# Patient Record
Sex: Female | Born: 2000 | Race: Black or African American | Hispanic: No | Marital: Single | Smoking: Never smoker
Health system: Southern US, Community
[De-identification: ages and names within clinical notes are randomized; demographics above are authoritative.]

## PROBLEM LIST (undated history)

## (undated) DIAGNOSIS — I10 Essential (primary) hypertension: Secondary | ICD-10-CM

---

## 2020-12-29 ENCOUNTER — Other Ambulatory Visit: Payer: Self-pay

## 2020-12-29 ENCOUNTER — Encounter (HOSPITAL_COMMUNITY): Payer: Self-pay | Admitting: Emergency Medicine

## 2020-12-29 ENCOUNTER — Emergency Department (HOSPITAL_COMMUNITY): Payer: 59

## 2020-12-29 ENCOUNTER — Emergency Department (HOSPITAL_COMMUNITY)
Admission: EM | Admit: 2020-12-29 | Discharge: 2020-12-29 | Disposition: A | Discharge status: Home / Self Care | Payer: 59 | Attending: Emergency Medicine | Admitting: Emergency Medicine

## 2020-12-29 DIAGNOSIS — H02846 Edema of left eye, unspecified eyelid: Secondary | ICD-10-CM | POA: Diagnosis not present

## 2020-12-29 DIAGNOSIS — S0990XA Unspecified injury of head, initial encounter: Secondary | ICD-10-CM | POA: Diagnosis present

## 2020-12-29 DIAGNOSIS — Y92410 Unspecified street and highway as the place of occurrence of the external cause: Secondary | ICD-10-CM | POA: Diagnosis not present

## 2020-12-29 DIAGNOSIS — H5789 Other specified disorders of eye and adnexa: Secondary | ICD-10-CM

## 2020-12-29 DIAGNOSIS — S060X0A Concussion without loss of consciousness, initial encounter: Secondary | ICD-10-CM | POA: Diagnosis not present

## 2020-12-29 LAB — POC URINE PREG, ED: Preg Test, Ur: NEGATIVE

## 2020-12-29 MED ORDER — NAPROXEN 500 MG PO TABS
500.0000 mg | ORAL_TABLET | Freq: Once | ORAL | Status: AC
  Administered 2020-12-29: 500 mg via ORAL
  Filled 2020-12-29: qty 1

## 2020-12-29 MED ORDER — TETRACAINE HCL 0.5 % OP SOLN
1.0000 [drp] | Freq: Once | OPHTHALMIC | Status: AC
  Administered 2020-12-29: 1 [drp] via OPHTHALMIC
  Filled 2020-12-29: qty 4

## 2020-12-29 MED ORDER — ACETAMINOPHEN 325 MG PO TABS
650.0000 mg | ORAL_TABLET | Freq: Once | ORAL | Status: AC
  Administered 2020-12-29: 650 mg via ORAL
  Filled 2020-12-29: qty 2

## 2020-12-29 MED ORDER — FLUORESCEIN SODIUM 1 MG OP STRP
1.0000 | ORAL_STRIP | Freq: Once | OPHTHALMIC | Status: AC
  Administered 2020-12-29: 1 via OPHTHALMIC
  Filled 2020-12-29: qty 1
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
## 2020-12-29 NOTE — Discharge Instructions (Addendum)
You have been seen and discharged from the emergency department.  You have sustained a concussion and left eye irritation.  CT imaging was negative.  Take Tylenol/ibuprofen for pain control.  Stay well-hydrated.  Follow-up with your primary provider for reevaluation and further care. Take home medications as prescribed. If you have any worsening symptoms or further concerns for your health please return to an emergency department for further evaluation.
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
## 2022-04-05 DIAGNOSIS — R69 Illness, unspecified: Secondary | ICD-10-CM | POA: Diagnosis not present

## 2022-04-10 DIAGNOSIS — R69 Illness, unspecified: Secondary | ICD-10-CM | POA: Diagnosis not present

## 2022-04-12 DIAGNOSIS — L501 Idiopathic urticaria: Secondary | ICD-10-CM | POA: Diagnosis not present

## 2022-04-26 DIAGNOSIS — R69 Illness, unspecified: Secondary | ICD-10-CM | POA: Diagnosis not present

## 2022-05-08 DIAGNOSIS — R69 Illness, unspecified: Secondary | ICD-10-CM | POA: Diagnosis not present

## 2022-06-12 DIAGNOSIS — R69 Illness, unspecified: Secondary | ICD-10-CM | POA: Diagnosis not present
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
## 2022-07-19 DIAGNOSIS — R69 Illness, unspecified: Secondary | ICD-10-CM | POA: Diagnosis not present

## 2022-08-10 DIAGNOSIS — R69 Illness, unspecified: Secondary | ICD-10-CM | POA: Diagnosis not present

## 2022-09-14 ENCOUNTER — Emergency Department (HOSPITAL_COMMUNITY)
Admission: EM | Admit: 2022-09-14 | Discharge: 2022-09-15 | Disposition: A | Discharge status: Home / Self Care | Payer: PRIVATE HEALTH INSURANCE | Attending: Emergency Medicine | Admitting: Emergency Medicine

## 2022-09-14 ENCOUNTER — Encounter (HOSPITAL_COMMUNITY): Payer: Self-pay | Admitting: Emergency Medicine

## 2022-09-14 DIAGNOSIS — Z7721 Contact with and (suspected) exposure to potentially hazardous body fluids: Secondary | ICD-10-CM | POA: Diagnosis not present

## 2022-09-14 LAB — COMPREHENSIVE METABOLIC PANEL
ALT: 13 U/L (ref 0–44)
AST: 19 U/L (ref 15–41)
Albumin: 4.3 g/dL (ref 3.5–5.0)
Alkaline Phosphatase: 45 U/L (ref 38–126)
Anion gap: 10 (ref 5–15)
BUN: 11 mg/dL (ref 6–20)
CO2: 24 mmol/L (ref 22–32)
Calcium: 9.3 mg/dL (ref 8.9–10.3)
Chloride: 104 mmol/L (ref 98–111)
Creatinine, Ser: 1.01 mg/dL — ABNORMAL HIGH (ref 0.44–1.00)
GFR, Estimated: >60 mL/min (ref >60)
Glucose, Bld: 92 mg/dL (ref 70–99)
Potassium: 3.5 mmol/L (ref 3.5–5.1)
Sodium: 138 mmol/L (ref 135–145)
Total Bilirubin: 0.5 mg/dL (ref 0.3–1.2)
Total Protein: 7.7 g/dL (ref 6.5–8.1)

## 2022-09-14 LAB — RAPID HIV SCREEN (HIV 1/2 AB+AG)
HIV 1/2 Antibodies: NONREACTIVE
HIV-1 P24 Antigen - HIV24: NONREACTIVE

## 2022-09-14 MED ORDER — ELVITEG-COBIC-EMTRICIT-TENOFAF 150-150-200-10 MG PO TABS: 1.0000 | ORAL_TABLET | Freq: Every day | ORAL | 0 refills | Status: DC

## 2022-09-14 MED ORDER — ELVITEG-COBIC-EMTRICIT-TENOFAF 150-150-200-10 MG PREPACK
1.0000 | ORAL_TABLET | Freq: Once | ORAL | Status: AC
  Administered 2022-09-14: 1 via ORAL
  Filled 2022-09-14: qty 1

## 2022-09-14 NOTE — ED Triage Notes (Addendum)
Pt presents for possible body fluid exposure.  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  She states that she has no obvious open wounds on her hands and used hand sanitizer after the exposure. There was no needle stick to this patient. 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   Pt denies PMH or daily meds that cause her to be immunocompromised.
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
## 2022-09-14 NOTE — Discharge Instructions (Addendum)
Start the prescription. Follow-up with employee health for additional testing. Your supervisor should have that information to employee health.  Follow-up Recommendations: - Check HIV Ab at 6 weeks, and a 4th generation Ag/Ab test or HIV RNA test at 3-4 months.

## 2022-09-15 LAB — HCG, QUANTITATIVE, PREGNANCY: hCG, Beta Chain, Quant, S: <1 m[IU]/mL (ref <5)

## 2022-09-15 LAB — HEPATITIS C ANTIBODY: HCV Ab: NONREACTIVE

## 2022-09-15 LAB — RPR: RPR Ser Ql: NONREACTIVE

## 2022-09-15 LAB — HEPATITIS B SURFACE ANTIGEN: Hepatitis B Surface Ag: NONREACTIVE

## 2022-10-01 ENCOUNTER — Ambulatory Visit: Payer: No Typology Code available for payment source | Admitting: Student

## 2023-07-31 ENCOUNTER — Other Ambulatory Visit: Payer: Self-pay

## 2023-07-31 ENCOUNTER — Encounter (HOSPITAL_COMMUNITY): Payer: Self-pay

## 2023-07-31 ENCOUNTER — Emergency Department (HOSPITAL_COMMUNITY)
Admission: EM | Admit: 2023-07-31 | Discharge: 2023-07-31 | Disposition: A | Discharge status: Home / Self Care | Payer: No Typology Code available for payment source

## 2023-07-31 ENCOUNTER — Emergency Department (HOSPITAL_COMMUNITY): Payer: No Typology Code available for payment source

## 2023-07-31 DIAGNOSIS — Z20822 Contact with and (suspected) exposure to covid-19: Secondary | ICD-10-CM | POA: Insufficient documentation

## 2023-07-31 DIAGNOSIS — J101 Influenza due to other identified influenza virus with other respiratory manifestations: Secondary | ICD-10-CM | POA: Insufficient documentation

## 2023-07-31 DIAGNOSIS — E876 Hypokalemia: Secondary | ICD-10-CM | POA: Diagnosis not present

## 2023-07-31 DIAGNOSIS — R059 Cough, unspecified: Secondary | ICD-10-CM | POA: Diagnosis present

## 2023-07-31 HISTORY — DX: Essential (primary) hypertension: I10

## 2023-07-31 LAB — CBC WITH DIFFERENTIAL/PLATELET
Abs Immature Granulocytes: 0.02 10*3/uL (ref 0.00–0.07)
Basophils Absolute: 0 10*3/uL (ref 0.0–0.1)
Basophils Relative: 0 %
Eosinophils Absolute: 0 10*3/uL (ref 0.0–0.5)
Eosinophils Relative: 1 %
HCT: 39.7 % (ref 36.0–46.0)
Hemoglobin: 12.9 g/dL (ref 12.0–15.0)
Immature Granulocytes: 0 %
Lymphocytes Relative: 5 %
Lymphs Abs: 0.3 10*3/uL — ABNORMAL LOW (ref 0.7–4.0)
MCH: 25.5 pg — ABNORMAL LOW (ref 26.0–34.0)
MCHC: 32.5 g/dL (ref 30.0–36.0)
MCV: 78.5 fL — ABNORMAL LOW (ref 80.0–100.0)
Monocytes Absolute: 0.4 10*3/uL (ref 0.1–1.0)
Monocytes Relative: 6 %
Neutro Abs: 5.8 10*3/uL (ref 1.7–7.7)
Neutrophils Relative %: 88 %
Platelets: 221 10*3/uL (ref 150–400)
RBC: 5.06 MIL/uL (ref 3.87–5.11)
RDW: 14.5 % (ref 11.5–15.5)
WBC: 6.6 10*3/uL (ref 4.0–10.5)
nRBC: 0 % (ref 0.0–0.2)

## 2023-07-31 LAB — COMPREHENSIVE METABOLIC PANEL
ALT: 14 U/L (ref 0–44)
AST: 23 U/L (ref 15–41)
Albumin: 4.6 g/dL (ref 3.5–5.0)
Alkaline Phosphatase: 51 U/L (ref 38–126)
Anion gap: 10 (ref 5–15)
BUN: 9 mg/dL (ref 6–20)
CO2: 24 mmol/L (ref 22–32)
Calcium: 9.3 mg/dL (ref 8.9–10.3)
Chloride: 99 mmol/L (ref 98–111)
Creatinine, Ser: 0.91 mg/dL (ref 0.44–1.00)
GFR, Estimated: >60 mL/min (ref >60)
Glucose, Bld: 135 mg/dL — ABNORMAL HIGH (ref 70–99)
Potassium: 3.2 mmol/L — ABNORMAL LOW (ref 3.5–5.1)
Sodium: 133 mmol/L — ABNORMAL LOW (ref 135–145)
Total Bilirubin: 0.4 mg/dL (ref <1.2)
Total Protein: 8.8 g/dL — ABNORMAL HIGH (ref 6.5–8.1)

## 2023-07-31 LAB — RESP PANEL BY RT-PCR (RSV, FLU A&B, COVID)  RVPGX2
Influenza A by PCR: POSITIVE — AB
Influenza B by PCR: NEGATIVE
Resp Syncytial Virus by PCR: NEGATIVE
SARS Coronavirus 2 by RT PCR: NEGATIVE

## 2023-07-31 LAB — I-STAT CG4 LACTIC ACID, ED: Lactic Acid, Venous: 0.8 mmol/L (ref 0.5–1.9)

## 2023-07-31 LAB — HCG, SERUM, QUALITATIVE: Preg, Serum: NEGATIVE

## 2023-07-31 MED ORDER — ONDANSETRON 4 MG PO TBDP: 4.0000 mg | ORAL_TABLET | Freq: Three times a day (TID) | ORAL | 0 refills | Status: DC | PRN

## 2023-07-31 MED ORDER — ACETAMINOPHEN 500 MG PO TABS
1000.0000 mg | ORAL_TABLET | Freq: Once | ORAL | Status: AC
  Administered 2023-07-31: 1000 mg via ORAL
  Filled 2023-07-31: qty 2

## 2023-07-31 MED ORDER — POTASSIUM CHLORIDE CRYS ER 20 MEQ PO TBCR: 20.0000 meq | EXTENDED_RELEASE_TABLET | Freq: Two times a day (BID) | ORAL | 0 refills | Status: DC

## 2023-07-31 NOTE — Discharge Instructions (Signed)
Please alternate Tylenol and ibuprofen.  You can take 400 mg of ibuprofen every 6 hours, and 1 g of Tylenol every 6 hours.  Do not take more than 4 g of Tylenol in 1 day.  Do this for fever control, and bodyaches.  I have also prescribed you some potassium, please have this rechecked in about a week, to make sure your labs are within normal limits.  Return to the ER if you have severe shortness of breath, or chest pain

## 2023-07-31 NOTE — ED Triage Notes (Signed)
Pt states that she had a cold 2 days that has worsened today. Pt reports taking thera flu, tylenol pm, and nyquil. Pt has had a runny nose, headache, and body aches.

## 2023-07-31 NOTE — ED Provider Notes (Signed)
[place] EMERGENCY DEPARTMENT AT [facility] Provider Note   CSN: [id number] Arrival date & time: 07/31/23  3875     History  Chief Complaint  Patient presents with   Generalized Body Aches    Joyce Bond is a 22 y.o. female, no pertinent past medical history, who presents to the ED secondary to runny nose, cough, sore throat, headache, and bodyaches, this been going on for the last 2 to 3 days.  She states that she just feels very unwell, and she is having chills.  Father at bedside, states she has had increased fatigue.  She denies any shortness of breath or chest pain however.  Home Medications Prior to Admission medications   Medication Sig Start Date End Date Taking? Authorizing Provider  ondansetron (ZOFRAN-ODT) 4 MG disintegrating tablet Take 1 tablet (4 mg total) by mouth every 8 (eight) hours as needed. 07/31/23  Yes Cia Garretson L, PA  potassium chloride SA (KLOR-CON M) 20 MEQ tablet Take 1 tablet (20 mEq total) by mouth 2 (two) times daily. 07/31/23  Yes Anibal Quinby L, PA  elvitegravir-cobicistat-emtricitabine-tenofovir (GENVOYA) 150-150-200-10 MG TABS tablet Take 1 tablet by mouth daily with breakfast. 09/14/22   Derwood Kaplan, MD      Allergies    Patient has no known allergies.    Review of Systems   Review of Systems  Physical Exam Updated Vital Signs BP 121/84 (BP Location: Right Arm)   Pulse (!) 110   Temp 100.3 F (37.9 C) (Oral)   Resp 20   Ht 5\' 9"  (1.753 m)   Wt 83.9 kg   LMP 07/17/2023   SpO2 99%   BMI 27.32 kg/m  Physical Exam Vitals and nursing note reviewed.  Constitutional:      General: She is not in acute distress.    Appearance: She is well-developed.  HENT:     Head: Normocephalic and atraumatic.     Right Ear: Tympanic membrane normal.     Left Ear: Tympanic membrane normal.     Nose: Congestion present.     Mouth/Throat:     Pharynx: Posterior oropharyngeal erythema present. No oropharyngeal exudate.  Eyes:      Conjunctiva/sclera: Conjunctivae normal.  Cardiovascular:     Rate and Rhythm: Normal rate and regular rhythm.     Heart sounds: No murmur heard. Pulmonary:     Effort: Pulmonary effort is normal. No respiratory distress.     Breath sounds: Normal breath sounds.  Abdominal:     Palpations: Abdomen is soft.     Tenderness: There is no abdominal tenderness.  Musculoskeletal:        General: No swelling.     Cervical back: Neck supple.  Skin:    General: Skin is warm and dry.     Capillary Refill: Capillary refill takes less than 2 seconds.  Neurological:     Mental Status: She is alert.  Psychiatric:        Mood and Affect: Mood normal.     ED Results / Procedures / Treatments   Labs (all labs ordered are listed, but only abnormal results are displayed) Labs Reviewed  RESP PANEL BY RT-PCR (RSV, FLU A&B, COVID)  RVPGX2 - Abnormal; Notable for the following components:      Result Value   Influenza A by PCR POSITIVE (*)    All other components within normal limits  COMPREHENSIVE METABOLIC PANEL - Abnormal; Notable for the following components:   Sodium 133 (*)  Potassium 3.2 (*)    Glucose, Bld 135 (*)    Total Protein 8.8 (*)    All other components within normal limits  CBC WITH DIFFERENTIAL/PLATELET - Abnormal; Notable for the following components:   MCV 78.5 (*)    MCH 25.5 (*)    Lymphs Abs 0.3 (*)    All other components within normal limits  CULTURE, BLOOD (ROUTINE X 2)  CULTURE, BLOOD (ROUTINE X 2)  HCG, SERUM, QUALITATIVE  URINALYSIS, W/ REFLEX TO CULTURE (INFECTION SUSPECTED)  PROTIME-INR  I-STAT CG4 LACTIC ACID, ED  I-STAT CG4 LACTIC ACID, ED    EKG None  Radiology DG Chest 2 View Result Date: 07/31/2023 CLINICAL DATA:  Chest congestion EXAM: CHEST - 2 VIEW COMPARISON:  None Available. FINDINGS: The heart size and mediastinal contours are within normal limits. Both lungs are clear. The visualized skeletal structures are unremarkable. IMPRESSION:  No active cardiopulmonary disease. Electronically Signed   By: Minerva Fester M.D.   On: 07/31/2023 21:41    Procedures Procedures    Medications Ordered in ED Medications  acetaminophen (TYLENOL) tablet 1,000 mg (1,000 mg Oral Given 07/31/23 1943)    ED Course/ Medical Decision Making/ A&P                                 Medical Decision Making Patient is a 22 year old female, overall well-appearing, just having nasal congestion, sore throat, headache, and body chills.  She is tachycardic and febrile.  Sepsis workup was initiated by triage.  She is well-appearing overall on my exam.  She is found to be influenza A positive.  She is out of the timeframe, for Tamiflu, thus that was not offered.  We discussed conservative management, and temperature control.  Strict return precautions tachycardia likely thought to be secondary to febrile status  Amount and/or Complexity of Data Reviewed Labs: ordered. Radiology: ordered.  Risk OTC drugs. Prescription drug management.    Final Clinical Impression(s) / ED Diagnoses Final diagnoses:  Influenza A  Hypokalemia    Rx / DC Orders ED Discharge Orders          Ordered    potassium chloride SA (KLOR-CON M) 20 MEQ tablet  2 times daily        07/31/23 2310    ondansetron (ZOFRAN-ODT) 4 MG disintegrating tablet  Every 8 hours PRN        07/31/23 2310              Arnesha Schiraldi L, PA 07/31/23 2310    Coral Spikes, DO 07/31/23 2341

## 2023-08-05 LAB — CULTURE, BLOOD (ROUTINE X 2)
Culture: NO GROWTH
Special Requests: ADEQUATE

## 2024-03-10 ENCOUNTER — Other Ambulatory Visit: Payer: Self-pay

## 2024-03-10 ENCOUNTER — Emergency Department (HOSPITAL_BASED_OUTPATIENT_CLINIC_OR_DEPARTMENT_OTHER)
Admission: EM | Admit: 2024-03-10 | Discharge: 2024-03-10 | Disposition: A | Discharge status: Home / Self Care | Attending: Emergency Medicine | Admitting: Emergency Medicine

## 2024-03-10 ENCOUNTER — Encounter (HOSPITAL_BASED_OUTPATIENT_CLINIC_OR_DEPARTMENT_OTHER): Payer: Self-pay | Admitting: Emergency Medicine

## 2024-03-10 DIAGNOSIS — Y92410 Unspecified street and highway as the place of occurrence of the external cause: Secondary | ICD-10-CM | POA: Insufficient documentation

## 2024-03-10 DIAGNOSIS — R519 Headache, unspecified: Secondary | ICD-10-CM | POA: Diagnosis not present

## 2024-03-10 DIAGNOSIS — M542 Cervicalgia: Secondary | ICD-10-CM | POA: Insufficient documentation

## 2024-03-10 MED ORDER — METHOCARBAMOL 500 MG PO TABS
500.0000 mg | ORAL_TABLET | Freq: Two times a day (BID) | ORAL | 0 refills | Status: DC
Start: 2024-03-10 — End: 2024-04-02

## 2024-03-10 MED ORDER — METHOCARBAMOL 500 MG PO TABS
500.0000 mg | ORAL_TABLET | Freq: Once | ORAL | Status: AC
  Administered 2024-03-10: 500 mg via ORAL
  Filled 2024-03-10: qty 1
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
## 2024-03-19 ENCOUNTER — Ambulatory Visit (INDEPENDENT_AMBULATORY_CARE_PROVIDER_SITE_OTHER): Admitting: Obstetrics and Gynecology

## 2024-03-19 ENCOUNTER — Encounter: Payer: Self-pay | Admitting: Obstetrics and Gynecology

## 2024-03-19 ENCOUNTER — Other Ambulatory Visit (HOSPITAL_COMMUNITY)
Admission: RE | Admit: 2024-03-19 | Discharge: 2024-03-19 | Disposition: A | Discharge status: Home / Self Care | Source: Ambulatory Visit | Attending: Obstetrics and Gynecology | Admitting: Obstetrics and Gynecology

## 2024-03-19 VITALS — BP 115/74 | HR 56 | Ht 68.0 in | Wt 187.0 lb

## 2024-03-19 DIAGNOSIS — F39 Unspecified mood [affective] disorder: Secondary | ICD-10-CM

## 2024-03-19 DIAGNOSIS — Z01419 Encounter for gynecological examination (general) (routine) without abnormal findings: Secondary | ICD-10-CM

## 2024-03-19 DIAGNOSIS — Z113 Encounter for screening for infections with a predominantly sexual mode of transmission: Secondary | ICD-10-CM | POA: Diagnosis present

## 2024-03-19 DIAGNOSIS — Z124 Encounter for screening for malignant neoplasm of cervix: Secondary | ICD-10-CM | POA: Insufficient documentation

## 2024-03-19 DIAGNOSIS — Z1331 Encounter for screening for depression: Secondary | ICD-10-CM

## 2024-03-19 NOTE — Progress Notes (Signed)
 ANNUAL GYNECOLOGY VISIT Chief Complaint  Patient presents with   new patinet annual    Would like STD testing- No Symptoms      Subjective:  Joyce Bond is a 23 y.o. G0P0000 who presents for annual exam.  Doing well, no concerns. +PHQ and GAD. Patient moving at end of month to [place]  for [employer]. Finished med school requirements and studying for the [employer]. Has been a stressful few months.    Gyn History: Patient's last menstrual period was 03/01/2024. Sexually active: yes/no: Yes but not currently Contraception: declines History of STIs: No Last pap: No results found for: DIAGPAP, HPV, HPVHIGH, reports last pap 2024, none available for my review today History of abnormal pap: No Periods: regular   The pregnancy intention screening data noted above was reviewed. Potential methods of contraception were discussed. The patient elected to proceed with No data recorded.       03/19/2024    3:59 PM  Depression screen PHQ 2/9  Decreased Interest 1  Down, Depressed, Hopeless 2  PHQ - 2 Score 3  Altered sleeping 3  Tired, decreased energy 3  Change in appetite 3  Feeling bad or failure about yourself  2  Trouble concentrating 1  Moving slowly or fidgety/restless 0  Suicidal thoughts 0  PHQ-9 Score 15        03/19/2024    3:59 PM  GAD 7 : Generalized Anxiety Score  Nervous, Anxious, on Edge 2  Control/stop worrying 1  Worry too much - different things 2  Trouble relaxing 1  Restless 0  Easily annoyed or irritable 3  Afraid - awful might happen 0  Total GAD 7 Score 9      OB History     Gravida  0   Para  0   Term  0   Preterm  0   AB  0   Living  0      SAB  0   IAB  0   Ectopic  0   Multiple  0   Live Births  0           Past Medical History:  Diagnosis Date   HTN (hypertension)     History reviewed. No pertinent surgical history.  Social History   Socioeconomic History   Marital status: Single    Spouse name: Not  on file   Number of children: Not on file   Years of education: Not on file   Highest education level: Not on file  Occupational History   Not on file  Tobacco Use   Smoking status: Never   Smokeless tobacco: Never  Vaping Use   Vaping status: Never Used  Substance and Sexual Activity   Alcohol use: Never    Comment: Socially   Drug use: Never   Sexual activity: Not Currently    Birth control/protection: None  Other Topics Concern   Not on file  Social History Narrative   Not on file   Social Drivers of Health   Financial Resource Strain: Not on file  Food Insecurity: Not on file  Transportation Needs: Not on file  Physical Activity: Not on file  Stress: Not on file  Social Connections: Not on file    Family History  Problem Relation Age of Onset   Diabetes Father    Hypertension Mother     Current Outpatient Medications on File Prior to Visit  Medication Sig Dispense Refill   hydrochlorothiazide (MICROZIDE) 12.5 MG capsule Take  12.5 mg by mouth daily.     hydrOXYzine (ATARAX) 25 MG tablet Take 12.5 mg by mouth at bedtime as needed.     elvitegravir-cobicistat-emtricitabine-tenofovir (GENVOYA ) 150-150-200-10 MG TABS tablet Take 1 tablet by mouth daily with breakfast. (Patient not taking: Reported on 03/19/2024) 30 tablet 0   methocarbamol  (ROBAXIN ) 500 MG tablet Take 1 tablet (500 mg total) by mouth 2 (two) times daily. (Patient not taking: Reported on 03/19/2024) 20 tablet 0   ondansetron  (ZOFRAN -ODT) 4 MG disintegrating tablet Take 1 tablet (4 mg total) by mouth every 8 (eight) hours as needed. (Patient not taking: Reported on 03/19/2024) 20 tablet 0   potassium chloride  SA (KLOR-CON  M) 20 MEQ tablet Take 1 tablet (20 mEq total) by mouth 2 (two) times daily. (Patient not taking: Reported on 03/19/2024) 6 tablet 0   No current facility-administered medications on file prior to visit.    No Known Allergies   Objective:   Vitals:   03/19/24 1606  BP: 115/74  Pulse:  (!) 56  Weight: 187 lb 0.6 oz (84.8 kg)  Height: 5' 8 (1.727 m)   Physical Examination:   General appearance - well appearing, and in no distress  Mental status - alert, oriented to person, place, and time  Psych:  normal mood and affect  Skin - warm and dry, normal color, no suspicious lesions noted  Chest - effort normal   Heart - normal rate    Neck:  midline trachea, no thyromegaly or nodules  Breasts - breasts appear normal, no suspicious masses, no skin or nipple changes or  axillary nodes  Abdomen - soft, nontender, nondistended, no masses or organomegaly  Pelvic -  VULVA: normal appearing vulva with no masses, tenderness or lesions   VAGINA: normal appearing vagina with normal color and discharge, no lesions  CERVIX: normal appearing cervix without discharge or lesions, no CMT  Thin prep pap is done with reflex HR HPV cotesting  UTERUS: uterus is felt to be normal size, shape, consistency and nontender   ADNEXA: No adnexal masses or tenderness noted.  Extremities:  No swelling or varicosities noted  Chaperone present for exam  Assessment and Plan:  1. Well woman exam with routine gynecological exam (Primary) Pap Normal clinical breast exam, reviewed self breast exams, mammograms to start at age 17 STI testing Declines contraception  2. Cervical cancer screening - Cytology - PAP( [place])  3. Routine screening for STI (sexually transmitted infection) - HIV Antibody (routine testing w rflx); Future - RPR; Future - Hepatitis B surface antigen; Future - Hepatitis C antibody; Future - Cervicovaginal ancillary only  4. Mood disorder (HCC) Support given, offered medications or BH referral, declines    No follow-ups on file.  Future Appointments  Date Time Provider Department Center  03/23/2024 11:00 AM [facility]  04/02/2024  1:20 PM Jerrell Cleatus Ned, MD [facility]    Rollo ONEIDA Bring, MD, FACOG Obstetrician & Gynecologist,  [facility] for [facility], [facility]

## 2024-03-23 ENCOUNTER — Other Ambulatory Visit

## 2024-03-23 LAB — CERVICOVAGINAL ANCILLARY ONLY
Chlamydia: NEGATIVE
Comment: NEGATIVE
Comment: NEGATIVE
Comment: NORMAL
Neisseria Gonorrhea: NEGATIVE
Trichomonas: NEGATIVE

## 2024-03-24 ENCOUNTER — Ambulatory Visit: Payer: Self-pay | Admitting: Obstetrics and Gynecology

## 2024-03-24 LAB — CYTOLOGY - PAP: Diagnosis: NEGATIVE

## 2024-03-28 ENCOUNTER — Other Ambulatory Visit: Payer: Self-pay

## 2024-03-28 ENCOUNTER — Emergency Department (HOSPITAL_BASED_OUTPATIENT_CLINIC_OR_DEPARTMENT_OTHER)
Admission: EM | Admit: 2024-03-28 | Discharge: 2024-03-28 | Disposition: A | Discharge status: Home / Self Care | Attending: Emergency Medicine | Admitting: Emergency Medicine

## 2024-03-28 ENCOUNTER — Encounter (HOSPITAL_BASED_OUTPATIENT_CLINIC_OR_DEPARTMENT_OTHER): Payer: Self-pay

## 2024-03-28 ENCOUNTER — Emergency Department (HOSPITAL_BASED_OUTPATIENT_CLINIC_OR_DEPARTMENT_OTHER)

## 2024-03-28 DIAGNOSIS — R638 Other symptoms and signs concerning food and fluid intake: Secondary | ICD-10-CM

## 2024-03-28 DIAGNOSIS — R079 Chest pain, unspecified: Secondary | ICD-10-CM | POA: Diagnosis not present

## 2024-03-28 DIAGNOSIS — R42 Dizziness and giddiness: Secondary | ICD-10-CM

## 2024-03-28 DIAGNOSIS — R110 Nausea: Secondary | ICD-10-CM | POA: Diagnosis not present

## 2024-03-28 DIAGNOSIS — R002 Palpitations: Secondary | ICD-10-CM | POA: Insufficient documentation

## 2024-03-28 DIAGNOSIS — R519 Headache, unspecified: Secondary | ICD-10-CM | POA: Diagnosis not present

## 2024-03-28 DIAGNOSIS — E860 Dehydration: Secondary | ICD-10-CM | POA: Diagnosis not present

## 2024-03-28 LAB — COMPREHENSIVE METABOLIC PANEL WITH GFR
ALT: 9 U/L (ref 0–44)
AST: 17 U/L (ref 15–41)
Albumin: 4.2 g/dL (ref 3.5–5.0)
Alkaline Phosphatase: 47 U/L (ref 38–126)
Anion gap: 9 (ref 5–15)
BUN: 12 mg/dL (ref 6–20)
CO2: 25 mmol/L (ref 22–32)
Calcium: 9.7 mg/dL (ref 8.9–10.3)
Chloride: 104 mmol/L (ref 98–111)
Creatinine, Ser: 0.85 mg/dL (ref 0.44–1.00)
GFR, Estimated: >60 mL/min (ref >60)
Glucose, Bld: 102 mg/dL — ABNORMAL HIGH (ref 70–99)
Potassium: 3.7 mmol/L (ref 3.5–5.1)
Sodium: 138 mmol/L (ref 135–145)
Total Bilirubin: 0.3 mg/dL (ref 0.0–1.2)
Total Protein: 7.1 g/dL (ref 6.5–8.1)

## 2024-03-28 LAB — URINALYSIS, ROUTINE W REFLEX MICROSCOPIC
Bilirubin Urine: NEGATIVE
Glucose, UA: NEGATIVE mg/dL
Hgb urine dipstick: NEGATIVE
Ketones, ur: NEGATIVE mg/dL
Nitrite: NEGATIVE
Protein, ur: NEGATIVE mg/dL
Specific Gravity, Urine: 1.016 (ref 1.005–1.030)
pH: 6 (ref 5.0–8.0)

## 2024-03-28 LAB — CBC
HCT: 35.1 % — ABNORMAL LOW (ref 36.0–46.0)
Hemoglobin: 11.3 g/dL — ABNORMAL LOW (ref 12.0–15.0)
MCH: 25.2 pg — ABNORMAL LOW (ref 26.0–34.0)
MCHC: 32.2 g/dL (ref 30.0–36.0)
MCV: 78.3 fL — ABNORMAL LOW (ref 80.0–100.0)
Platelets: 216 K/uL (ref 150–400)
RBC: 4.48 MIL/uL (ref 3.87–5.11)
RDW: 14 % (ref 11.5–15.5)
WBC: 4.4 K/uL (ref 4.0–10.5)
nRBC: 0 % (ref 0.0–0.2)

## 2024-03-28 LAB — PREGNANCY, URINE: Preg Test, Ur: NEGATIVE

## 2024-03-28 LAB — TROPONIN T, HIGH SENSITIVITY: Troponin T High Sensitivity: <15 ng/L (ref 0–19)

## 2024-03-28 LAB — LIPASE, BLOOD: Lipase: 45 U/L (ref 11–51)

## 2024-03-28 MED ORDER — DIPHENHYDRAMINE HCL 25 MG PO CAPS
25.0000 mg | ORAL_CAPSULE | Freq: Once | ORAL | Status: AC
  Administered 2024-03-28: 25 mg via ORAL
  Filled 2024-03-28: qty 1

## 2024-03-28 MED ORDER — SODIUM CHLORIDE 0.9 % IV BOLUS
1000.0000 mL | Freq: Once | INTRAVENOUS | Status: AC
  Administered 2024-03-28: 1000 mL via INTRAVENOUS

## 2024-03-28 MED ORDER — METOCLOPRAMIDE HCL 5 MG/ML IJ SOLN
10.0000 mg | Freq: Once | INTRAMUSCULAR | Status: AC
  Administered 2024-03-28: 10 mg via INTRAVENOUS
  Filled 2024-03-28: qty 2

## 2024-03-28 MED ORDER — METOCLOPRAMIDE HCL 10 MG PO TABS: 10.0000 mg | ORAL_TABLET | Freq: Four times a day (QID) | ORAL | 0 refills | Status: DC

## 2024-03-28 NOTE — Discharge Instructions (Addendum)
 Try to drink 5-8 glasses of water each day Tylenol  1000mg  every 6 hours as needed for headache

## 2024-03-28 NOTE — ED Provider Notes (Signed)
 [place] EMERGENCY DEPARTMENT AT [facility] Provider Note   CSN: [id number] Arrival date & time: 03/28/24  9088     Patient presents with: Dizziness and Chest Pain   Joyce Bond is a 23 y.o. female.    Dizziness Associated symptoms: chest pain   Chest Pain Associated symptoms: dizziness    Patient is a 23 year old female in the emergency department with complaints of 2+ weeks of some heightened awareness of her chest, maybe palpitations, sometimes lightheadedness.  She denies chest pain but states that she feels more aware of this area.  She does not specifically feel that her heart is beating irregularly but is very aware of her heartbeat.  She states that the sensation is not exertional she has not passed out.  She denies any hemoptysis cough or shortness of breath.  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  No recent surgeries, hospitalization, long travel, hemoptysis, estrogen containing OCP, cancer history.  No unilateral leg swelling.  No history of PE or VTE.      Prior to Admission medications   Medication Sig Start Date End Date Taking? Authorizing Provider  metoCLOPramide  (REGLAN ) 10 MG tablet Take 1 tablet (10 mg total) by mouth every 6 (six) hours. 03/28/24  Yes Neldon Inoue S, PA  elvitegravir-cobicistat-emtricitabine-tenofovir (GENVOYA ) 150-150-200-10 MG TABS tablet Take 1 tablet by mouth daily with breakfast. Patient not taking: Reported on 03/19/2024 09/14/22   Nanavati, Ankit, MD  hydrochlorothiazide  (MICROZIDE ) 12.5 MG capsule Take 12.5 mg by mouth daily. 12/28/23   [provider]  hydrOXYzine (ATARAX) 25 MG tablet Take 12.5 mg by mouth at bedtime as needed.    [provider]  methocarbamol  (ROBAXIN ) 500 MG tablet Take 1 tablet (500 mg total) by mouth 2 (two) times daily. Patient not taking: Reported on 03/19/2024  03/10/24   Smoot, Lauraine LABOR, PA-C  ondansetron  (ZOFRAN -ODT) 4 MG disintegrating tablet Take 1 tablet (4 mg total) by mouth every 8 (eight) hours as needed. Patient not taking: Reported on 03/19/2024 07/31/23   Small, Brooke L, PA  potassium chloride  SA (KLOR-CON  M) 20 MEQ tablet Take 1 tablet (20 mEq total) by mouth 2 (two) times daily. Patient not taking: Reported on 03/19/2024 07/31/23   Small, Brooke L, PA    Allergies: Patient has no known allergies.    Review of Systems  Cardiovascular:  Positive for chest pain.  Neurological:  Positive for dizziness.    Updated Vital Signs BP (!) 139/101   Pulse 75   Temp 98 F (36.7 C) (Oral)   Resp 15   Ht 5' 8 (1.727 m)   Wt 81.2 kg   LMP 03/01/2024 (Exact Date)   SpO2 100%   BMI 27.22 kg/m   Physical Exam Vitals and nursing note reviewed.  Constitutional:      General: She is not in acute distress. HENT:     Head: Normocephalic and atraumatic.     Nose: Nose normal.     Mouth/Throat:     Mouth: Mucous membranes are moist.  Eyes:     General: No scleral icterus. Cardiovascular:     Rate and Rhythm: Normal rate and regular rhythm.     Pulses: Normal pulses.     Heart sounds: Normal heart sounds.  Pulmonary:     Effort: Pulmonary effort is normal. No respiratory distress.     Breath sounds: No  wheezing.  Abdominal:     Palpations: Abdomen is soft.     Tenderness: There is no abdominal tenderness. There is no guarding or rebound.  Musculoskeletal:     Cervical back: Normal range of motion.     Right lower leg: No edema.     Left lower leg: No edema.  Skin:    General: Skin is warm and dry.     Capillary Refill: Capillary refill takes less than 2 seconds.  Neurological:     Mental Status: She is alert. Mental status is at baseline.  Psychiatric:        Mood and Affect: Mood normal.        Behavior: Behavior normal.     (all labs ordered are listed, but only abnormal results are displayed) Labs Reviewed  COMPREHENSIVE  METABOLIC PANEL WITH GFR - Abnormal; Notable for the following components:      Result Value   Glucose, Bld 102 (*)    All other components within normal limits  CBC - Abnormal; Notable for the following components:   Hemoglobin 11.3 (*)    HCT 35.1 (*)    MCV 78.3 (*)    MCH 25.2 (*)    All other components within normal limits  URINALYSIS, ROUTINE W REFLEX MICROSCOPIC - Abnormal; Notable for the following components:   Leukocytes,Ua MODERATE (*)    Bacteria, UA RARE (*)    All other components within normal limits  LIPASE, BLOOD  PREGNANCY, URINE  TROPONIN T, HIGH SENSITIVITY    EKG: None  Radiology: DG Chest Portable 1 View Result Date: 03/28/2024 CLINICAL DATA:  Chest pain EXAM: PORTABLE CHEST 1 VIEW COMPARISON:  None Available. FINDINGS: Normal mediastinum and cardiac silhouette. Normal pulmonary vasculature. No evidence of effusion, infiltrate, or pneumothorax. No acute bony abnormality. IMPRESSION: No acute cardiopulmonary process. Electronically Signed   By: Jackquline Boxer M.D.   On: 03/28/2024 10:14     Procedures   Medications Ordered in the ED  metoCLOPramide  (REGLAN ) injection 10 mg (10 mg Intravenous Given 03/28/24 1007)  diphenhydrAMINE  (BENADRYL ) capsule 25 mg (25 mg Oral Given 03/28/24 1007)  sodium chloride  0.9 % bolus 1,000 mL (0 mLs Intravenous Stopped 03/28/24 1150)    Clinical Course as of 03/28/24 1605  Sat Mar 28, 2024  0945 For 2+ weeks palpitations, sometimes LH No syncope.  Not exertional.  [WF]    Clinical Course User Index [WF] Neldon Hamp RAMAN, [place]                                 Medical Decision Making Amount and/or Complexity of Data Reviewed Labs: ordered. Radiology: ordered.  Risk Prescription drug management.   This patient presents to the ED for concern of CP, this involves a number of treatment options, and is a complaint that carries with it a moderate to high risk of complications and morbidity. A differential diagnosis was  considered for the patient's symptoms which is discussed below:   The emergent causes of chest pain include: Acute coronary syndrome, tamponade, pericarditis/myocarditis, aortic dissection, pulmonary embolism, tension pneumothorax, pneumonia, and esophageal rupture.  I do not believe the patient has an emergent cause of chest pain, other urgent/non-acute considerations include, but are not limited to: chronic angina, aortic stenosis, cardiomyopathy, mitral valve prolapse, pulmonary hypertension, aortic insufficiency, right ventricular hypertrophy, pleuritis, bronchitis, pneumothorax, tumor, gastroesophageal reflux disease (GERD), esophageal spasm, Mallory-Weiss syndrome, peptic ulcer disease, pancreatitis, functional gastrointestinal pain,  cervical or thoracic disk disease or arthritis, shoulder arthritis, costochondritis, subacromial bursitis, anxiety or panic attack, herpes zoster, breast disorders, chest wall tumors, thoracic outlet syndrome, mediastinitis.    Co morbidities: Discussed in HPI   Brief History:  Patient is a 23 year old female in the emergency department with complaints of 2+ weeks of some heightened awareness of her chest, maybe palpitations, sometimes lightheadedness.  She denies chest pain but states that she feels more aware of this area.  She does not specifically feel that her heart is beating irregularly but is very aware of her heartbeat.  She states that the sensation is not exertional she has not passed out.  She denies any hemoptysis cough or shortness of breath.  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  No recent surgeries, hospitalization, long travel, hemoptysis, estrogen containing OCP, cancer history.  No unilateral leg swelling.  No history of PE or VTE.     EMR reviewed including pt PMHx, past surgical history and past visits to ER.   See HPI  for more details   Lab Tests:  I personally reviewed all laboratory work and imaging. Metabolic panel without any acute abnormality specifically kidney function within normal limits and no significant electrolyte abnormalities. CBC without leukocytosis or significant anemia.   Imaging Studies:  NAD. I personally reviewed all imaging studies and no acute abnormality found. I agree with radiology interpretation. Chest x-ray unremarkable   Cardiac Monitoring:  The patient was maintained on a cardiac monitor.  I personally viewed and interpreted the cardiac monitored which showed an underlying rhythm of: NSR EKG non-ischemic   Medicines ordered:  I ordered medication including 1 L of normal saline, Benadryl , Reglan  for headache, nausea, hydration Reevaluation of the patient after these medicines showed that the patient improved I have reviewed the patients home medicines and have made adjustments as needed   Critical Interventions:     Consults/Attending Physician      Reevaluation:  After the interventions noted above I re-evaluated patient and found that they have :improved   Social Determinants of Health:      Problem List / ED Course:  Considered list of concerning chest pain differentials patient's description of chest pain is actually more consistent with palpitations.  Considered thyroid testing however she is not tachycardic and does not appear to be in thyroid storm clinically.  Not anemic blood sugar is normal electrolytes normal recommend outpatient follow-up.  Certainly also considered anxiety as a cause of her symptoms given that she was post take the MCAT this morning and the symptoms precluded her from doing so.  Also considered PE although I think very unlikely in this particular patient as she is PERC negative and well-appearing and feels improved after some fluids and Benadryl  and Reglan .  Will discharge home return precautions to emergency room were  provided.   Dispostion:  After consideration of the diagnostic results and the patients response to treatment, I feel that the patent would benefit from discharge home with close follow-up with primary care.   Final diagnoses:  Acute nonintractable headache, unspecified headache type  Light headed  Dehydration symptoms    ED Discharge Orders          Ordered    metoCLOPramide  (REGLAN ) 10 MG tablet  Every 6 hours        03/28/24 1134  Neldon Inoue [place]ass[place], [place] 03/28/24 1611    Levander Houston, MD 04/02/24 [phone]
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
## 2024-03-31 DIAGNOSIS — J301 Allergic rhinitis due to pollen: Secondary | ICD-10-CM | POA: Insufficient documentation

## 2024-03-31 DIAGNOSIS — L501 Idiopathic urticaria: Secondary | ICD-10-CM | POA: Insufficient documentation

## 2024-03-31 DIAGNOSIS — J3081 Allergic rhinitis due to animal (cat) (dog) hair and dander: Secondary | ICD-10-CM | POA: Insufficient documentation

## 2024-03-31 DIAGNOSIS — J309 Allergic rhinitis, unspecified: Secondary | ICD-10-CM | POA: Insufficient documentation

## 2024-04-02 ENCOUNTER — Encounter: Payer: Self-pay | Admitting: Student in an Organized Health Care Education/Training Program

## 2024-04-02 ENCOUNTER — Ambulatory Visit (INDEPENDENT_AMBULATORY_CARE_PROVIDER_SITE_OTHER): Admitting: Student in an Organized Health Care Education/Training Program

## 2024-04-02 VITALS — BP 156/119 | HR 71 | Ht 68.0 in | Wt 189.8 lb

## 2024-04-02 DIAGNOSIS — Z309 Encounter for contraceptive management, unspecified: Secondary | ICD-10-CM | POA: Diagnosis not present

## 2024-04-02 DIAGNOSIS — G43C0 Periodic headache syndromes in child or adult, not intractable: Secondary | ICD-10-CM

## 2024-04-02 DIAGNOSIS — E611 Iron deficiency: Secondary | ICD-10-CM | POA: Diagnosis not present

## 2024-04-02 DIAGNOSIS — I10 Essential (primary) hypertension: Secondary | ICD-10-CM | POA: Diagnosis not present

## 2024-04-02 DIAGNOSIS — G43909 Migraine, unspecified, not intractable, without status migrainosus: Secondary | ICD-10-CM | POA: Insufficient documentation

## 2024-04-02 LAB — IBC + FERRITIN
Ferritin: 19 ng/mL (ref 10.0–291.0)
Iron: 64 ug/dL (ref 42–145)
Saturation Ratios: 18 % — ABNORMAL LOW (ref 20.0–50.0)
TIBC: 355.6 ug/dL (ref 250.0–450.0)
Transferrin: 254 mg/dL (ref 212.0–360.0)

## 2024-04-02 MED ORDER — SUMATRIPTAN SUCCINATE 25 MG PO TABS: 25.0000 mg | ORAL_TABLET | ORAL | 0 refills | Status: DC | PRN

## 2024-04-02 MED ORDER — HYDROCHLOROTHIAZIDE 12.5 MG PO CAPS: 12.5000 mg | ORAL_CAPSULE | Freq: Every day | ORAL | 3 refills | Status: AC

## 2024-04-02 NOTE — Assessment & Plan Note (Signed)
 Suspected iron  deficiency anemia, possibly due to heavy menstrual bleeding, with symptoms of lightheadedness and fatigue. Plan to confirm anemia with lab tests and initiate treatment if confirmed. Hormonal birth control could reduce menstrual bleeding and potentially improve anemia. Order an iron  level test and prescribe iron  supplements if iron  deficiency is confirmed. Recheck iron  levels in 2-3 months if supplements are started.

## 2024-04-02 NOTE — Assessment & Plan Note (Signed)
 Patient is currently not using hormonal birth control.  She is using barrier contraception with condoms.  Has some previous concerns about starting hormonal because of possible weight gain.  I offered her hormonal birth control and recommended it in order to treat menorrhagia.  She is going to consider.
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
## 2024-04-02 NOTE — Assessment & Plan Note (Signed)
 Diagnosed with essential hypertension last year, her blood pressure is elevated at 156/119 mmHg, likely due to recent energy drink consumption, though home readings are generally acceptable. Discussed dietary modifications and potential medication adjustments. Hydrochlorothiazide  is not ideal if pregnancy occurs. Prescribe hydrochlorothiazide  12.5 mg for a 72-month supply. Advise reducing energy drink consumption and increasing water intake. Consider increasing hydrochlorothiazide  to 25 mg if blood pressure remains elevated.
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
## 2024-04-03 ENCOUNTER — Ambulatory Visit: Payer: Self-pay | Admitting: Student in an Organized Health Care Education/Training Program

## 2024-04-03 DIAGNOSIS — E611 Iron deficiency: Secondary | ICD-10-CM

## 2024-04-03 MED ORDER — POLYSACCHARIDE IRON COMPLEX 150 MG PO CAPS: 150.0000 mg | ORAL_CAPSULE | Freq: Every day | ORAL | 1 refills | Status: AC

## 2024-04-03 NOTE — Telephone Encounter (Signed)
 FYI   Patient states she will call once she is back into town.

## 2024-04-29 ENCOUNTER — Other Ambulatory Visit: Payer: Self-pay | Admitting: Student in an Organized Health Care Education/Training Program

## 2024-04-29 DIAGNOSIS — G43C0 Periodic headache syndromes in child or adult, not intractable: Secondary | ICD-10-CM

## 2024-08-20 ENCOUNTER — Ambulatory Visit: Admitting: Student in an Organized Health Care Education/Training Program

## 2024-08-20 ENCOUNTER — Encounter: Payer: Self-pay | Admitting: Student in an Organized Health Care Education/Training Program

## 2024-08-20 VITALS — BP 121/84 | HR 80 | Wt 194.0 lb

## 2024-08-20 DIAGNOSIS — F988 Other specified behavioral and emotional disorders with onset usually occurring in childhood and adolescence: Secondary | ICD-10-CM | POA: Insufficient documentation

## 2024-08-20 DIAGNOSIS — E611 Iron deficiency: Secondary | ICD-10-CM

## 2024-08-20 DIAGNOSIS — R41840 Attention and concentration deficit: Secondary | ICD-10-CM

## 2024-08-20 DIAGNOSIS — I10 Essential (primary) hypertension: Secondary | ICD-10-CM

## 2024-08-20 LAB — BASIC METABOLIC PANEL WITH GFR
BUN: 15 mg/dL (ref 6–23)
CO2: 30 meq/L (ref 19–32)
Calcium: 9.5 mg/dL (ref 8.4–10.5)
Chloride: 102 meq/L (ref 96–112)
Creatinine, Ser: 0.91 mg/dL (ref 0.40–1.20)
GFR: 88.69 mL/min
Glucose, Bld: 89 mg/dL (ref 70–99)
Potassium: 4.2 meq/L (ref 3.5–5.1)
Sodium: 139 meq/L (ref 135–145)

## 2024-08-20 LAB — IBC + FERRITIN
Ferritin: 16.3 ng/mL (ref 10.0–291.0)
Iron: 69 ug/dL (ref 42–145)
Saturation Ratios: 19.7 % — ABNORMAL LOW (ref 20.0–50.0)
TIBC: 350 ug/dL (ref 250.0–450.0)
Transferrin: 250 mg/dL (ref 212.0–360.0)

## 2024-08-20 NOTE — Progress Notes (Signed)
 "  Established Patient Office Visit  Patient ID: Joyce Bond, female    DOB: 12/28/2000  Age: 24 y.o. MRN: [id number] PCP: Jerrell Cleatus Ned, MD  Chief Complaint  Patient presents with   Hypertension    Patient was seen on 12/18 in urgent care for hight BP readings. Patient states everything is going good but would like to go over symptoms of appendicitis as she has been having on an off pain right abdomin.     Subjective:     HPI  Discussed the use of AI scribe software for clinical note transcription with the patient, who gave verbal consent to proceed.  History of Present Illness Joyce Bond is a 24 year old female with hypertension who presents with concerns about medication interactions and abdominal pain.  She is concerned about potential interactions between her prescribed Adderall and her blood pressure medication. She is currently taking 15 mg of Adderall on an as-needed basis, primarily on days when she needs to focus on studying. Her psychiatrist recently increased the dose to 20 mg, but she has not started taking the higher dose yet. She is also taking hydrochlorothiazide  for her hypertension and reports no issues with this medication, such as lightheadedness or other side effects.  She has a history of abdominal pain that began in October, characterized by severe pain in the abdomen that made it difficult to walk and caused her to be 'cropped over for a long period of time.' The pain resolved after three days but recurred in December, though less severe. During the initial episode, she also experienced symptoms like rhinorrhea, headache, and sneezing, leading her to believe she might have been sick. She has not sought medical attention specifically for this pain but is unsure of the cause and was trying to figure out what it could be.  She monitors her blood pressure regularly and reports an average reading of 121/84 mmHg. However, she experienced a high reading of  149/132 mmHg in December, which prompted her to visit urgent care. The urgent care provider did not believe the reading was accurate. She uses an upper arm cuff for her measurements.  She has a history of heavy menstrual bleeding on the first day of her period, which then becomes lighter. She is not currently on birth control and is practicing abstinence with her boyfriend. She has not picked up her prescribed iron  supplements but has attempted to improve her diet to address potential low iron  levels.     Objective:     BP 121/84   Pulse 80   Wt 194 lb (88 kg)   SpO2 100%   BMI 29.50 kg/m   Physical Exam  Gen: Well-appearing woman Neck: Normal thyroid, no nodules or adenopathy Heart: Regular, no murmur Lungs: Unlabored, clear throughout Ext: Warm, no edema, normal joints    Assessment & Plan:   Problem List Items Addressed This Visit       High   Hypertension - Primary (Chronic)   Her hypertension is well-controlled with hydrochlorothiazide , with home readings averaging 121/84 mmHg. Low-dose Adderall use is safe. Continue hydrochlorothiazide  12.5 mg orally daily. Check blood pressure once or twice a week. Adjust Adderall dosage as needed, avoiding high doses.      Relevant Orders   Basic metabolic panel with GFR   ADD (attention deficit disorder) (Chronic)   Chronic issue.  Being managed with a psychiatrist.  Recently started Adderall at a low dose and finding good benefits.  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  Using this medication only as needed but seems to be doing really well with it.        Medium    Iron  deficiency (Chronic)   Her iron  deficiency anemia is managed with dietary changes, and she shows no symptoms of low iron . Iron  levels were rechecked today. Continue dietary iron  fortification and consider iron  supplements if needed.      Relevant Orders   IBC + Ferritin    Return in about 6 months (around 02/17/2025).    Cleatus Debby Specking, MD [facility]   "

## 2024-08-20 NOTE — Patient Instructions (Signed)
" °  VISIT SUMMARY: During today's visit, we discussed your concerns about potential medication interactions and your abdominal pain. We reviewed your current medications, including Adderall and hydrochlorothiazide , and addressed your history of abdominal pain and heavy menstrual bleeding. We also checked your blood pressure and iron  levels.  YOUR PLAN: -HYPERTENSION: Hypertension, or high blood pressure, is being well-managed with your current medication, hydrochlorothiazide . Your home blood pressure readings are good, averaging 121/84 mmHg. You can continue taking hydrochlorothiazide  12.5 mg daily and monitor your blood pressure once or twice a week. You can adjust your Adderall dosage as needed, but avoid high doses.  -IRON  DEFICIENCY ANEMIA: Iron  deficiency anemia occurs when your body doesn't have enough iron  to produce adequate red blood cells. Your condition is being managed with dietary changes, and you are not showing symptoms of low iron . We rechecked your iron  levels today. Continue to fortify your diet with iron -rich foods and consider taking iron  supplements if necessary.  INSTRUCTIONS: Please continue taking your hydrochlorothiazide  as prescribed and monitor your blood pressure once or twice a week. Adjust your Adderall dosage as needed, but avoid high doses. Continue with your dietary changes to manage iron  deficiency and consider taking iron  supplements if needed. Follow up with us  if you experience any new symptoms or have concerns about your medications or health.   "

## 2024-08-20 NOTE — Assessment & Plan Note (Signed)
 Her hypertension is well-controlled with hydrochlorothiazide , with home readings averaging 121/84 mmHg. Low-dose Adderall use is safe. Continue hydrochlorothiazide  12.5 mg orally daily. Check blood pressure once or twice a week. Adjust Adderall dosage as needed, avoiding high doses.

## 2024-08-20 NOTE — Assessment & Plan Note (Signed)
 Her iron  deficiency anemia is managed with dietary changes, and she shows no symptoms of low iron . Iron  levels were rechecked today. Continue dietary iron  fortification and consider iron  supplements if needed.

## 2024-08-20 NOTE — Assessment & Plan Note (Signed)
 Chronic issue.  Being managed with a psychiatrist.  Recently started Adderall at a low dose and finding good benefits.  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  Using this medication only as needed but seems to be doing really well with it.

## 2024-08-21 ENCOUNTER — Ambulatory Visit: Payer: Self-pay | Admitting: Student in an Organized Health Care Education/Training Program

## 2024-08-21 NOTE — Telephone Encounter (Signed)
 Please advise - patient is wanting to know if liquid vitamins are okay to take?

## 2025-02-19 ENCOUNTER — Ambulatory Visit: Payer: Self-pay | Admitting: Student in an Organized Health Care Education/Training Program
